# Patient Record
Sex: Female | Born: 1990 | Race: Black or African American | Hispanic: No | Marital: Single | Smoking: Former smoker
Health system: Southern US, Community
[De-identification: ages and names within clinical notes are randomized; demographics above are authoritative.]

## PROBLEM LIST (undated history)

## (undated) ENCOUNTER — Inpatient Hospital Stay (HOSPITAL_COMMUNITY): Payer: Self-pay

## (undated) DIAGNOSIS — R519 Headache, unspecified: Secondary | ICD-10-CM

## (undated) DIAGNOSIS — R51 Headache: Secondary | ICD-10-CM

## (undated) DIAGNOSIS — Z789 Other specified health status: Secondary | ICD-10-CM

## (undated) HISTORY — PX: NO PAST SURGERIES: SHX2092

---
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
## 2008-11-22 IMAGING — US US OB COMP LESS 14 WK
1 series · 14 of 19 positions shown · non-contrast
Comparison: None.

CLINICAL DATA: Positive pregnancy test with cramping

[Series 1: us ob comp less 14 wk · 0.19mm/px · 14 of 19 slices shown]
[im 1/19]
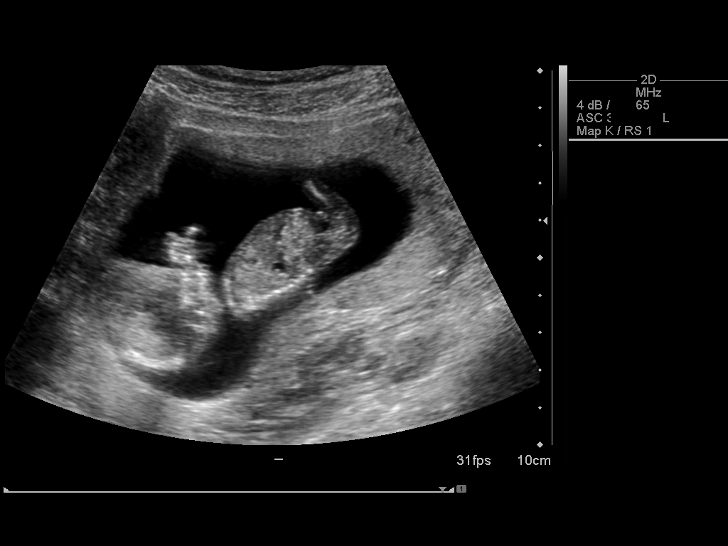
[im 3/19]
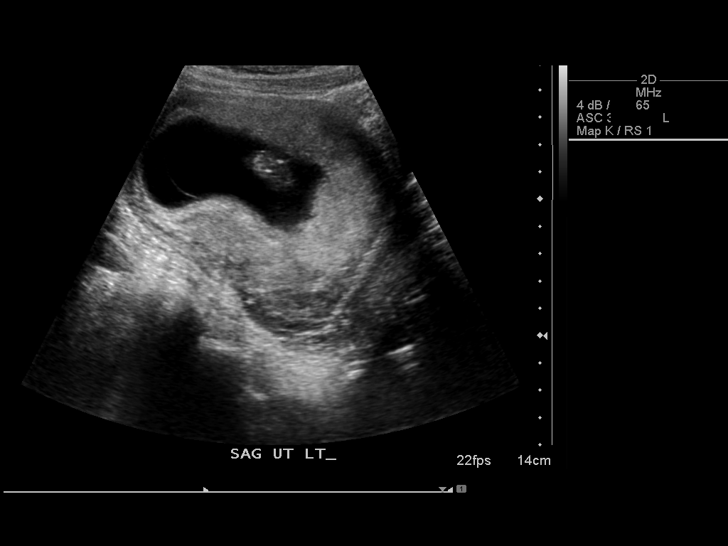
[im 4/19]
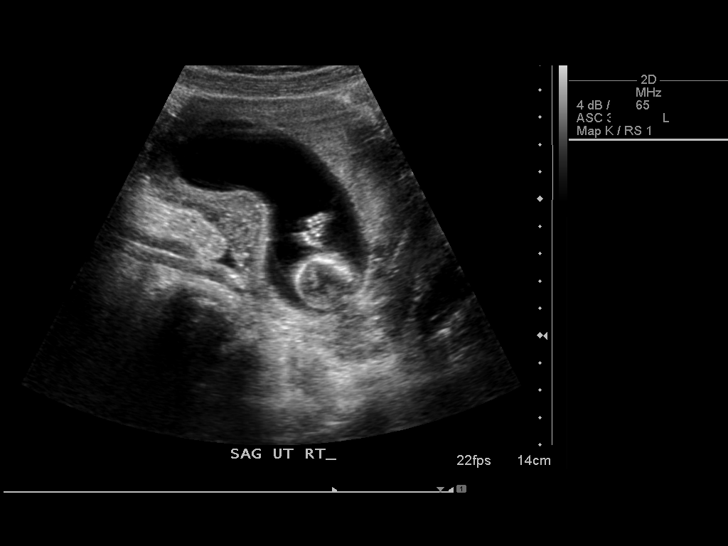
[im 5/19]
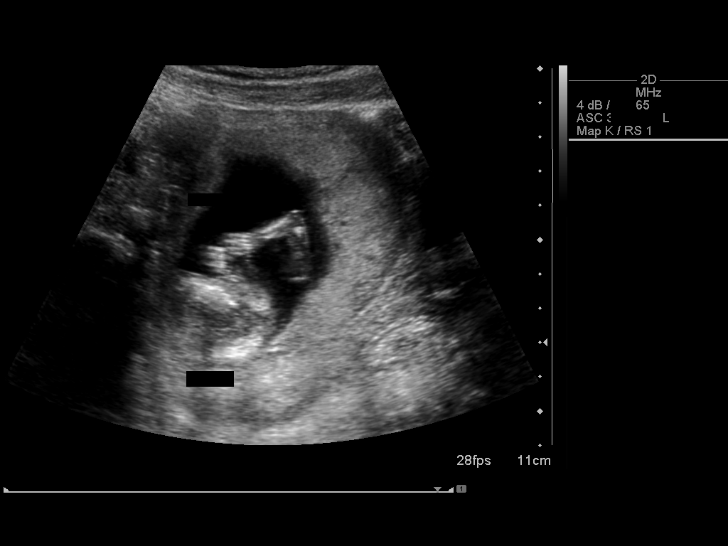
[im 7/19]
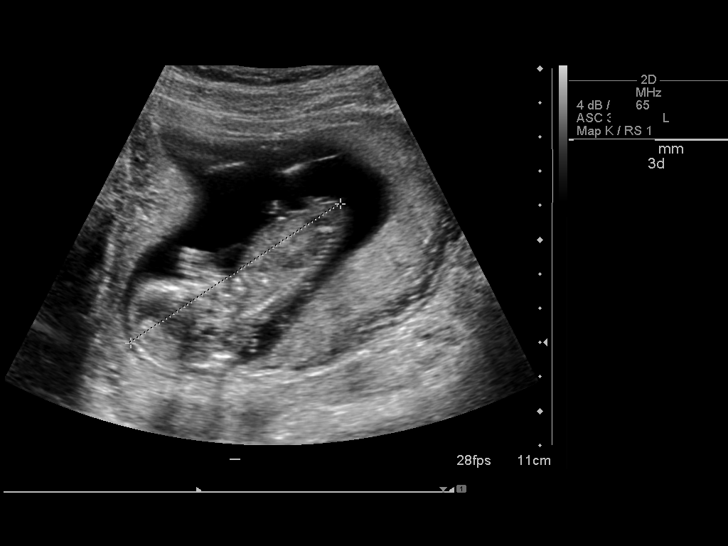
[im 8/19]
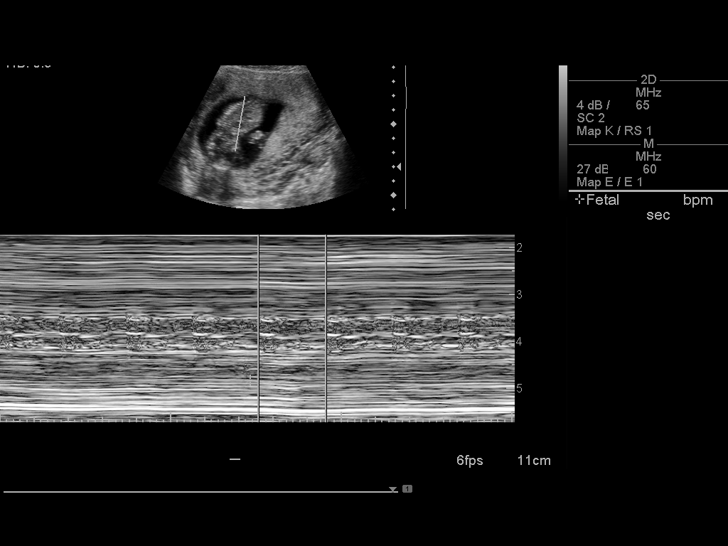
[im 9/19]
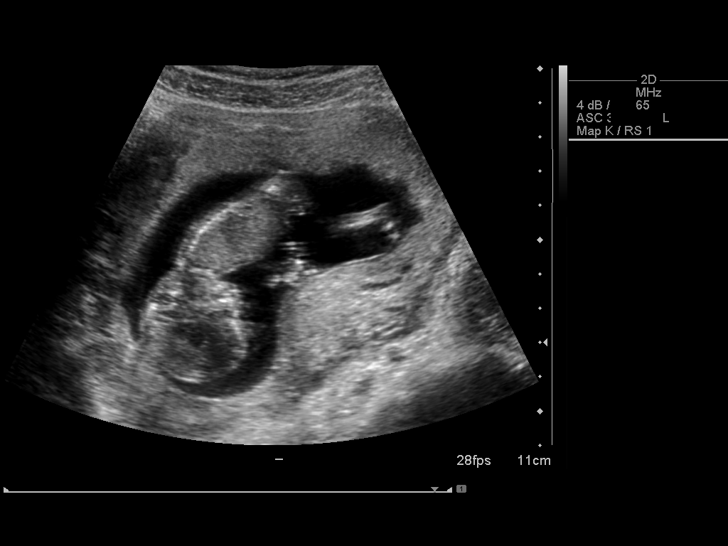
[im 11/19]
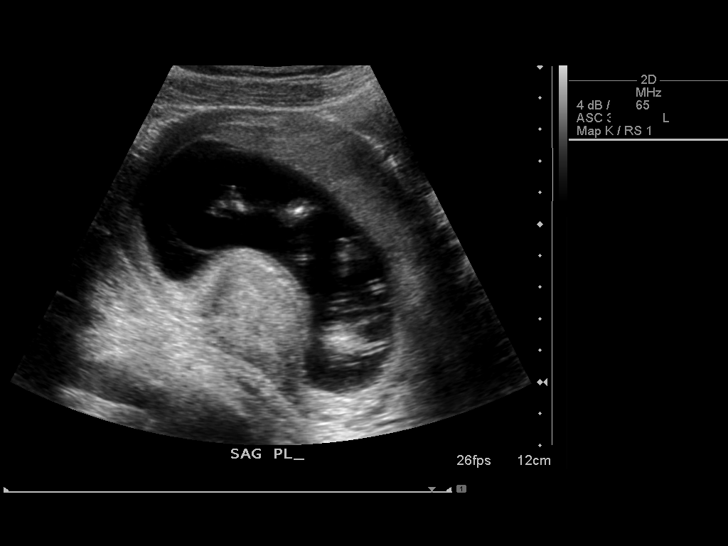
[im 12/19]
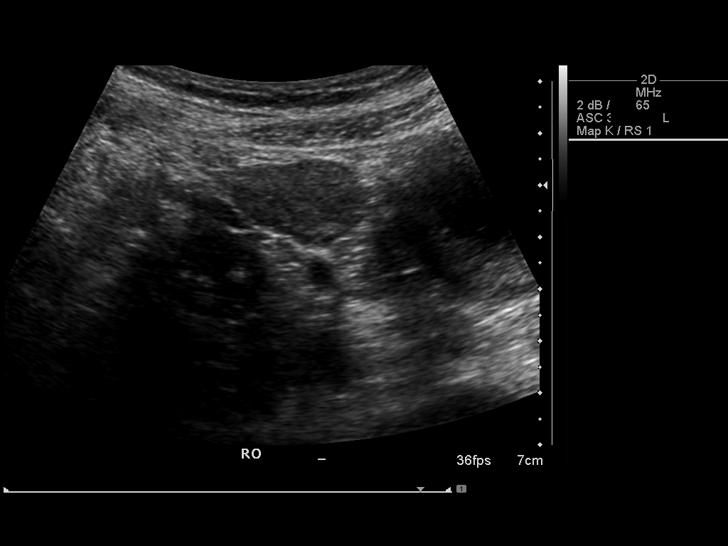
[im 13/19]
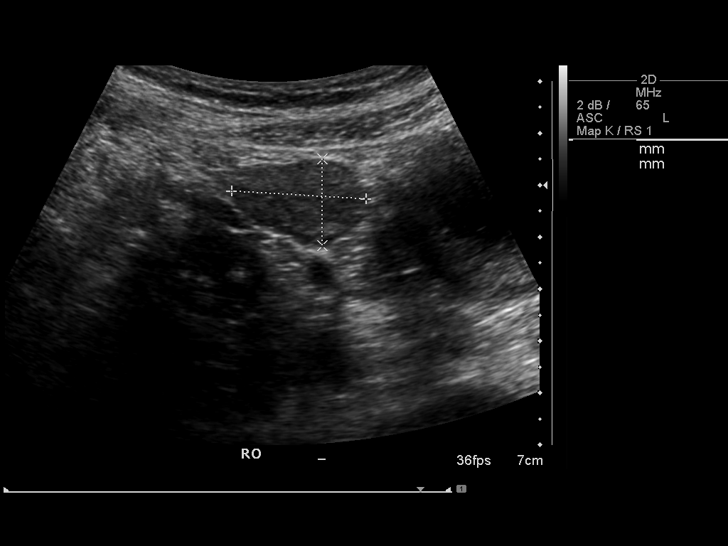
[im 15/19]
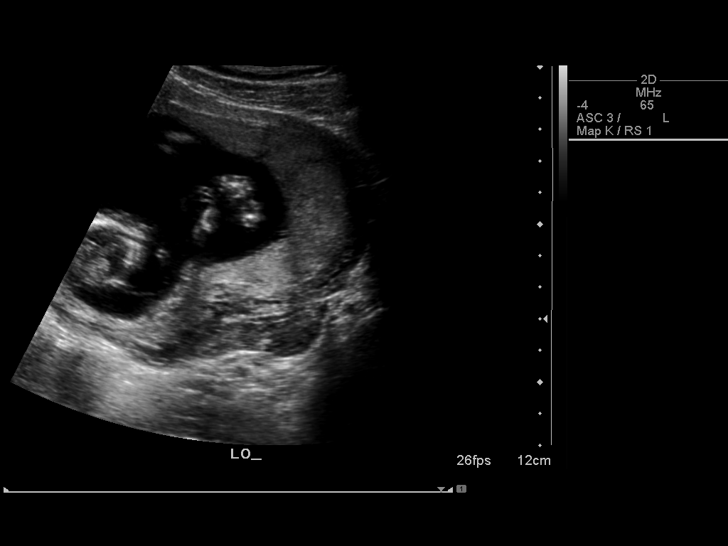
[im 16/19]
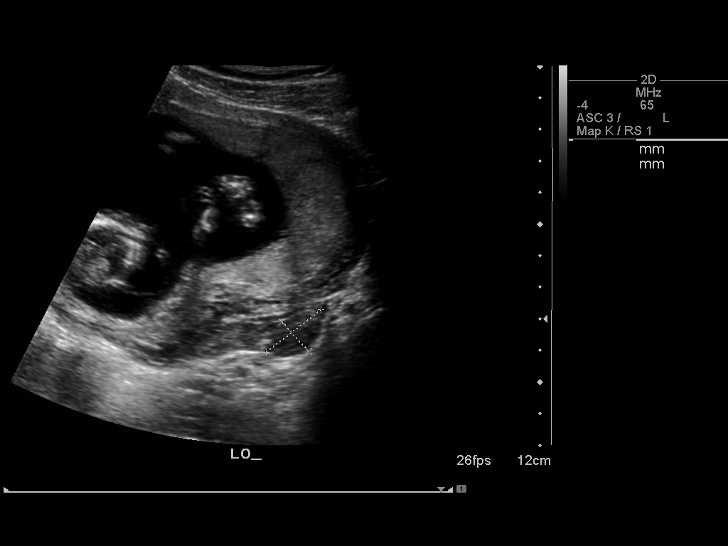
[im 17/19]
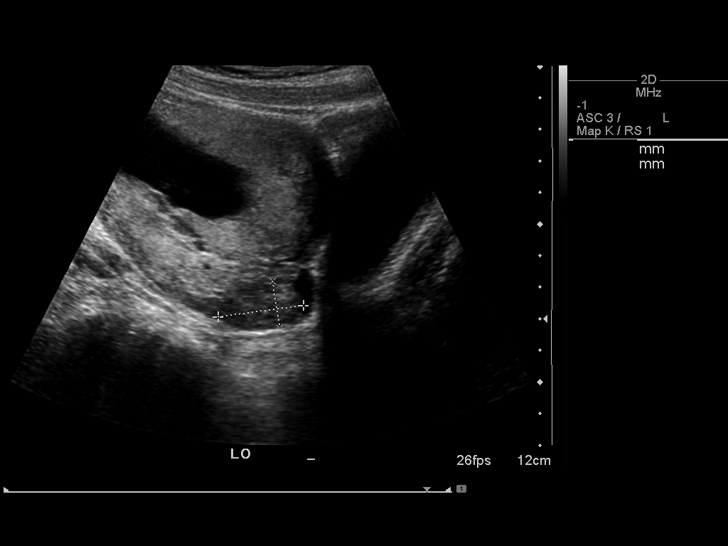
[im 19/19]
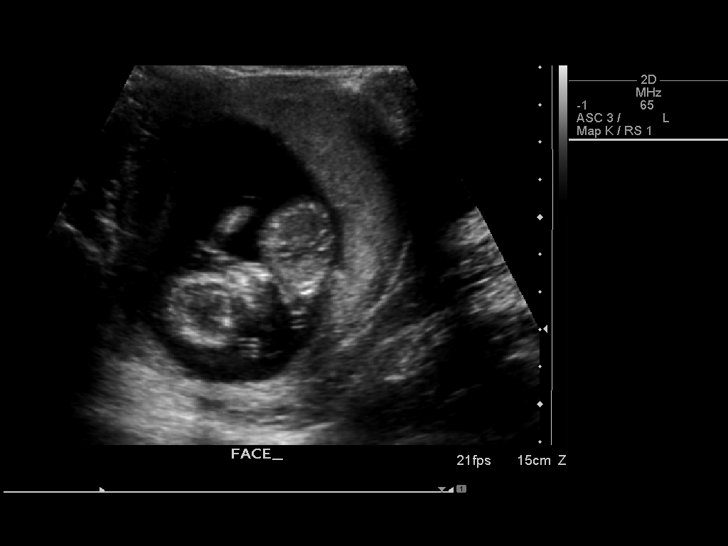

[14 of 19 positions shown; findings below may reference images not displayed]

OBSTETRICAL ULTRASOUND (<14 WK/AND TRANSVAGINAL OB):

Transabdominal and endovaginal scanning was performed.
FINDING: A single living intrauterine gestational sac is identified. A fetus and
amnion are evident although no yolk sac is visualized. Fetal heart activity is
noted and the fetal heart rate is measured at 152 beats per minute. No evidence
for subchorionic hemorrhage.

The crown-rump length is 7.3 cm which estimates a 13 week 3 day gestational age.

The ovaries are unremarkable. There is no free intraperitoneal fluid.
IMPRESSION: Single living intrauterine gestation at 13 week 3 day estimated gestational age
by ultrasound.

## 2011-03-26 LAB — RPR: RPR Ser Ql: NONREACTIVE

## 2011-03-26 LAB — CBC
HCT: 36.1
Hemoglobin: 14.8
MCHC: 34.2
MCV: 91.9
MCV: 93.9
RBC: 3.84 — ABNORMAL LOW
RBC: 4.59
RDW: 13.1
RDW: 13.6
WBC: 9.1

## 2011-04-07 LAB — URINE CULTURE

## 2011-04-07 LAB — URINALYSIS, ROUTINE W REFLEX MICROSCOPIC
Ketones, ur: NEGATIVE
Nitrite: POSITIVE — AB
Specific Gravity, Urine: 1.02

## 2011-04-07 LAB — URINE MICROSCOPIC-ADD ON

## 2011-04-07 LAB — WET PREP, GENITAL
Clue Cells Wet Prep HPF POC: NONE SEEN
Yeast Wet Prep HPF POC: NONE SEEN

## 2011-04-07 LAB — HCG, QUANTITATIVE, PREGNANCY: hCG, Beta Chain, Quant, S: 85354 — ABNORMAL HIGH

## 2011-04-07 LAB — CBC
HCT: 33.2 — ABNORMAL LOW
Platelets: 253
RBC: 3.92
RDW: 15.9 — ABNORMAL HIGH

## 2011-04-07 LAB — GC/CHLAMYDIA PROBE AMP, GENITAL: GC Probe Amp, Genital: NEGATIVE

## 2011-04-07 LAB — POCT PREGNANCY, URINE
Operator id: 114931
Preg Test, Ur: POSITIVE

## 2012-03-10 ENCOUNTER — Ambulatory Visit: Payer: Self-pay | Admitting: Family Medicine

## 2012-03-10 VITALS — BP 127/74 | HR 76 | Temp 98.6°F | Resp 16 | Ht 67.5 in | Wt 141.0 lb

## 2012-03-10 DIAGNOSIS — S39012A Strain of muscle, fascia and tendon of lower back, initial encounter: Secondary | ICD-10-CM

## 2012-03-10 DIAGNOSIS — IMO0002 Reserved for concepts with insufficient information to code with codable children: Secondary | ICD-10-CM

## 2012-03-10 MED ORDER — TRAMADOL HCL 50 MG PO TABS: 50.0000 mg | ORAL_TABLET | Freq: Three times a day (TID) | ORAL | Status: AC | PRN

## 2012-03-10 MED ORDER — CYCLOBENZAPRINE HCL 10 MG PO TABS: 10.0000 mg | ORAL_TABLET | Freq: Two times a day (BID) | ORAL | Status: AC | PRN

## 2012-03-10 NOTE — Patient Instructions (Signed)
If you are not better in the next few days please let me know.

## 2012-03-10 NOTE — Progress Notes (Signed)
[facility] [address] [place] [address] [phone]  Date:  03/10/2012   Name:  Kelly Mcguire   DOB:  12-Mar-1991   MRN:  [id number]  PCP:  No primary provider on file.    Chief Complaint: Back Pain   History of Present Illness:  Kelly Mcguire is a 21 y.o. very pleasant female patient who presents with the following:  On 20-Dec-2022 she was doing some dead lifts at the gym. She has had problems with her back a few times after she has dead- lifted.  She did not have any pain until the next day.  She hurts down the middle of her back in the lumbar area.  No leg involvement. No incontinence of bowel or bladder.    She is generally healthy.  She has tried some ibuprofen and ice.  However these measures are not helping her much.  She has most of her pain during the day, especially if she bends down.    There is no problem list on file for this patient.   No past medical history on file.  No past surgical history on file.  History  Substance Use Topics  . Smoking status: Never Smoker   . Smokeless tobacco: Not on file  . Alcohol Use: Not on file    No family history on file.  No Known Allergies  Medication list has been reviewed and updated.  No current outpatient prescriptions on file prior to visit.    Review of Systems:  As per HPI- otherwise negative.   Physical Examination: Filed Vitals:   03/10/12 1448  BP: 127/74  Pulse: 76  Temp: 98.6 F (37 C)  Resp: 16   Filed Vitals:   03/10/12 1448  Height: 5' 7.5" (1.715 m)  Weight: 141 lb (63.957 kg)   Body mass index is 21.76 kg/(m^2). Ideal Body Weight: Weight in (lb) to have BMI = 25: 161.7   GEN: WDWN, NAD, Non-toxic, A & O x 3 HEENT: Atraumatic, Normocephalic. Neck supple. No masses, No LAD. Ears and Nose: No external deformity. CV: RRR, No M/G/R. No JVD. No thrill. No extra heart sounds. PULM: CTA B, no wheezes, crackles, rhonchi. No retractions. No resp. distress. No  accessory muscle use. Back: mild tenderness in Paraspinous muscles of lumbar area.  Stiffness to flexion, normal rotation. Normal strength and sensation of both legs, negative SLR, normal DTR both legs.   EXTR: No c/c/e NEURO Normal gait.  PSYCH: Normally interactive. Conversant. Not depressed or anxious appearing.  Calm demeanor.    Assessment and Plan: 1. Back strain  cyclobenzaprine (FLEXERIL) 10 MG tablet, traMADol (ULTRAM) 50 MG tablet   Back strain- treat as above.  She has been hurt with doing dead lifts a few times in the past, so I suggested that she avoid this exercise in the future.  Let me know if not better in the next couple of days.    Abbe Amsterdam, MD

## 2018-06-13 ENCOUNTER — Encounter (HOSPITAL_COMMUNITY): Payer: Self-pay | Admitting: *Deleted

## 2018-06-13 ENCOUNTER — Other Ambulatory Visit: Payer: Self-pay

## 2018-06-13 ENCOUNTER — Inpatient Hospital Stay (HOSPITAL_COMMUNITY)
Admission: AD | Admit: 2018-06-13 | Discharge: 2018-06-13 | Disposition: A | Discharge status: Home / Self Care | Payer: Self-pay | Attending: Obstetrics and Gynecology | Admitting: Obstetrics and Gynecology

## 2018-06-13 DIAGNOSIS — R0600 Dyspnea, unspecified: Secondary | ICD-10-CM

## 2018-06-13 DIAGNOSIS — Z3A18 18 weeks gestation of pregnancy: Secondary | ICD-10-CM | POA: Insufficient documentation

## 2018-06-13 DIAGNOSIS — R51 Headache: Secondary | ICD-10-CM | POA: Insufficient documentation

## 2018-06-13 DIAGNOSIS — O26892 Other specified pregnancy related conditions, second trimester: Secondary | ICD-10-CM | POA: Insufficient documentation

## 2018-06-13 DIAGNOSIS — Z3492 Encounter for supervision of normal pregnancy, unspecified, second trimester: Secondary | ICD-10-CM

## 2018-06-13 DIAGNOSIS — Z87891 Personal history of nicotine dependence: Secondary | ICD-10-CM | POA: Insufficient documentation

## 2018-06-13 HISTORY — DX: Headache, unspecified: R51.9

## 2018-06-13 HISTORY — DX: Headache: R51

## 2018-06-13 LAB — CBC WITH DIFFERENTIAL/PLATELET
Basophils Absolute: 0 10*3/uL (ref 0.0–0.1)
Basophils Relative: 0 %
EOS PCT: 1 %
Eosinophils Absolute: 0 10*3/uL (ref 0.0–0.5)
HCT: 35.5 % — ABNORMAL LOW (ref 36.0–46.0)
Hemoglobin: 12.3 g/dL (ref 12.0–15.0)
LYMPHS ABS: 1.7 10*3/uL (ref 0.7–4.0)
Lymphocytes Relative: 22 %
MCH: 30.2 pg (ref 26.0–34.0)
MCHC: 34.6 g/dL (ref 30.0–36.0)
MCV: 87.2 fL (ref 80.0–100.0)
MONO ABS: 0.6 10*3/uL (ref 0.1–1.0)
MONOS PCT: 8 %
Neutro Abs: 5.4 10*3/uL (ref 1.7–7.7)
Neutrophils Relative %: 69 %
PLATELETS: 206 10*3/uL (ref 150–400)
RBC: 4.07 MIL/uL (ref 3.87–5.11)
RDW: 13.8 % (ref 11.5–15.5)
WBC: 7.8 10*3/uL (ref 4.0–10.5)
nRBC: 0 % (ref 0.0–0.2)

## 2018-06-13 LAB — URINALYSIS, ROUTINE W REFLEX MICROSCOPIC
BILIRUBIN URINE: NEGATIVE
Glucose, UA: NEGATIVE mg/dL
HGB URINE DIPSTICK: NEGATIVE
Ketones, ur: NEGATIVE mg/dL
Leukocytes, UA: NEGATIVE
Nitrite: NEGATIVE
Protein, ur: NEGATIVE mg/dL
SPECIFIC GRAVITY, URINE: 1.02 (ref 1.005–1.030)
pH: 6 (ref 5.0–8.0)

## 2018-06-13 LAB — COMPREHENSIVE METABOLIC PANEL
ALT: 22 U/L (ref 0–44)
AST: 20 U/L (ref 15–41)
Albumin: 3.4 g/dL — ABNORMAL LOW (ref 3.5–5.0)
Alkaline Phosphatase: 51 U/L (ref 38–126)
Anion gap: 7 (ref 5–15)
BUN: 10 mg/dL (ref 6–20)
CO2: 22 mmol/L (ref 22–32)
Calcium: 8.8 mg/dL — ABNORMAL LOW (ref 8.9–10.3)
Chloride: 103 mmol/L (ref 98–111)
Creatinine, Ser: 0.49 mg/dL (ref 0.44–1.00)
Glucose, Bld: 77 mg/dL (ref 70–99)
POTASSIUM: 3.3 mmol/L — AB (ref 3.5–5.1)
Sodium: 132 mmol/L — ABNORMAL LOW (ref 135–145)
TOTAL PROTEIN: 7.3 g/dL (ref 6.5–8.1)
Total Bilirubin: 0.2 mg/dL — ABNORMAL LOW (ref 0.3–1.2)

## 2018-06-13 LAB — RAPID URINE DRUG SCREEN, HOSP PERFORMED
AMPHETAMINES: NOT DETECTED
Barbiturates: NOT DETECTED
Benzodiazepines: NOT DETECTED
COCAINE: NOT DETECTED
OPIATES: NOT DETECTED
Tetrahydrocannabinol: NOT DETECTED

## 2018-06-13 MED ORDER — PREPLUS 27-1 MG PO TABS: 1.0000 | ORAL_TABLET | Freq: Every day | ORAL | 13 refills | Status: AC

## 2018-06-13 MED ORDER — ACETAMINOPHEN 500 MG PO TABS
1000.0000 mg | ORAL_TABLET | Freq: Once | ORAL | Status: AC
  Administered 2018-06-13: 1000 mg via ORAL
  Filled 2018-06-13: qty 2

## 2018-06-13 NOTE — MAU Provider Note (Signed)
History     CSN: [id number]  Arrival date and time: 06/13/18 1223   First Provider Initiated Contact with Patient 06/13/18 1403      Chief Complaint  Patient presents with  . Headache  . Shortness of Breath   HPI Kelly Mcguire is a 27 y.o. G3P1011 at [redacted]w[redacted]d who presents to MAU with chief complaint of sharp stabbing chest pain with SOB. This is a recurrent problem with new level of severity last night at 11:00pm and lasting for 30 minutes. She denies history of cardiac issues, acid reflux, drug use, smoking, recent illness or fever.   Headache Patient also complains of headache, new onset during pregnancy. She endorses frontal headaches without aura or loss of vision or sensations. She rates them as 7-9/10 and states she is missing 2-3 days per week of work which is causing her significant stress. Patient has not taken medication or tried other treatments for this problem.   Patient denies pregnancy-related complaints. She denies vaginal bleeding, abdominal pain, abdominal tenderness and UTI symptoms.  Patient has not initiated prenatal care. She states she cannot afford to pay out of pocket for [facility] and her insurance does not start until 2020.  OB History    Gravida  3   Para  1   Term  1   Preterm      AB  1   Living  1     SAB  1   TAB      Ectopic      Multiple      Live Births  1           Past Medical History:  Diagnosis Date  . Headache     Past Surgical History:  Procedure Laterality Date  . NO PAST SURGERIES      No family history on file.  Social History   Tobacco Use  . Smoking status: Former Games developer  . Smokeless tobacco: Former Neurosurgeon  . Tobacco comment: Hooka  Substance Use Topics  . Alcohol use: Not Currently  . Drug use: Never    Allergies: No Known Allergies  Medications Prior to Admission  Medication Sig Dispense Refill Last Dose  . ibuprofen (ADVIL,MOTRIN) 400 MG tablet Take 400 mg by mouth every 6 (six) hours as  needed.       Review of Systems  Constitutional: Negative for chills and fatigue.  Respiratory: Positive for shortness of breath. Negative for apnea, cough, choking, chest tightness, wheezing and stridor.   Gastrointestinal: Negative for abdominal pain.  Genitourinary: Negative for vaginal bleeding, vaginal discharge and vaginal pain.  Musculoskeletal: Negative for back pain.  Neurological: Positive for headaches.  All other systems reviewed and are negative.  Physical Exam   Pulse (!) 110, temperature 98 F (36.7 C), temperature source Oral, resp. rate 16, weight 73.1 kg, last menstrual period 02/03/2018, SpO2 99 %.  Physical Exam  Nursing note and vitals reviewed. Constitutional: She appears well-developed and well-nourished.  Cardiovascular: Normal rate, normal heart sounds and intact distal pulses.  Respiratory: Effort normal and breath sounds normal. No tachypnea.  GI: Soft. She exhibits no distension and no mass. There is no abdominal tenderness. There is no rebound, no guarding and no CVA tenderness.  Gravid  Skin: Skin is warm and dry.  Psychiatric: She has a normal mood and affect. Her behavior is normal. Judgment and thought content normal.    MAU Course/MDM   --No concerning findings on physical exam or labs collected today --EKG reviewed,  normal sinus rhythm. Patient speaking in full sentences, ambulating without difficulty, no signs of air hunger --Patient's pain resolved with Tylenol administered in MAU --Discussed programs [facility] for [facility]. Strongly advised patient to [facility] or [facility] to learn more  Patient Vitals for the past 24 hrs:  Temp Temp src Pulse Resp SpO2 Weight  06/13/18 1513 - - - 16 - -  06/13/18 1244 98 F (36.7 C) Oral (!) 110 16 99 % 73.1 kg  06/13/18 1242 - - - - 100 % -    Results for orders placed or performed during the hospital encounter of 06/13/18 (from the past 24 hour(s))  Rapid urine drug screen (hospital  performed)     Status: None   Collection Time: 06/13/18  1:00 PM  Result Value Ref Range   Opiates NONE DETECTED NONE DETECTED   Cocaine NONE DETECTED NONE DETECTED   Benzodiazepines NONE DETECTED NONE DETECTED   Amphetamines NONE DETECTED NONE DETECTED   Tetrahydrocannabinol NONE DETECTED NONE DETECTED   Barbiturates NONE DETECTED NONE DETECTED  Urinalysis, Routine w reflex microscopic     Status: Abnormal   Collection Time: 06/13/18  1:06 PM  Result Value Ref Range   Color, Urine YELLOW YELLOW   APPearance HAZY (A) CLEAR   Specific Gravity, Urine 1.020 1.005 - 1.030   pH 6.0 5.0 - 8.0   Glucose, UA NEGATIVE NEGATIVE mg/dL   Hgb urine dipstick NEGATIVE NEGATIVE   Bilirubin Urine NEGATIVE NEGATIVE   Ketones, ur NEGATIVE NEGATIVE mg/dL   Protein, ur NEGATIVE NEGATIVE mg/dL   Nitrite NEGATIVE NEGATIVE   Leukocytes, UA NEGATIVE NEGATIVE  CBC with Differential/Platelet     Status: Abnormal   Collection Time: 06/13/18  2:21 PM  Result Value Ref Range   WBC 7.8 4.0 - 10.5 K/uL   RBC 4.07 3.87 - 5.11 MIL/uL   Hemoglobin 12.3 12.0 - 15.0 g/dL   HCT 16.135.5 (L) 09.636.0 - 04.546.0 %   MCV 87.2 80.0 - 100.0 fL   MCH 30.2 26.0 - 34.0 pg   MCHC 34.6 30.0 - 36.0 g/dL   RDW 40.913.8 81.111.5 - 91.415.5 %   Platelets 206 150 - 400 K/uL   nRBC 0.0 0.0 - 0.2 %   Neutrophils Relative % 69 %   Neutro Abs 5.4 1.7 - 7.7 K/uL   Lymphocytes Relative 22 %   Lymphs Abs 1.7 0.7 - 4.0 K/uL   Monocytes Relative 8 %   Monocytes Absolute 0.6 0.1 - 1.0 K/uL   Eosinophils Relative 1 %   Eosinophils Absolute 0.0 0.0 - 0.5 K/uL   Basophils Relative 0 %   Basophils Absolute 0.0 0.0 - 0.1 K/uL  Comprehensive metabolic panel     Status: Abnormal   Collection Time: 06/13/18  2:21 PM  Result Value Ref Range   Sodium 132 (L) 135 - 145 mmol/L   Potassium 3.3 (L) 3.5 - 5.1 mmol/L   Chloride 103 98 - 111 mmol/L   CO2 22 22 - 32 mmol/L   Glucose, Bld 77 70 - 99 mg/dL   BUN 10 6 - 20 mg/dL   Creatinine, Ser 7.820.49 0.44 - 1.00  mg/dL   Calcium 8.8 (L) 8.9 - 10.3 mg/dL   Total Protein 7.3 6.5 - 8.1 g/dL   Albumin 3.4 (L) 3.5 - 5.0 g/dL   AST 20 15 - 41 U/L   ALT 22 0 - 44 U/L   Alkaline Phosphatase 51 38 - 126 U/L   Total  Bilirubin 0.2 (L) 0.3 - 1.2 mg/dL   GFR calc non Af Amer >60 >60 mL/min   GFR calc Af Amer >60 >60 mL/min   Anion gap 7 5 - 15    Assessment and Plan  --27 y.o. G3P1011 at [redacted]w[redacted]d  --FHT 156 by Doppler --Headache responsive to Tylenol --Discharge home in stable condition  Calvert Cantor, CNM 06/13/2018, 4:13 PM

## 2018-06-13 NOTE — MAU Note (Signed)
Been having migraines every day.  Making her nauseous. Very sensitive to light.  Last night had pain in the middle of her back - came through to the front, not today. Having shortness of breath, feels like someone is sitting on her chest, like it is constricted.  The other day was having sharp pain in her RLQ, almost made her fall, no longer having it.  No care yet, waiting on insurance

## 2018-06-13 NOTE — Discharge Instructions (Signed)
Second Trimester of Pregnancy The second trimester is from week 13 through week 28, month 4 through 6. This is often the time in pregnancy that you feel your best. Often times, morning sickness has lessened or quit. You may have more energy, and you may get hungry more often. Your unborn baby (fetus) is growing rapidly. At the end of the sixth month, he or she is about 9 inches long and weighs about 1 pounds. You will likely feel the baby move (quickening) between 18 and 20 weeks of pregnancy. Follow these instructions at home:  Avoid all smoking, herbs, and alcohol. Avoid drugs not approved by your doctor.  Do not use any tobacco products, including cigarettes, chewing tobacco, and electronic cigarettes. If you need help quitting, ask your doctor. You may get counseling or other support to help you quit.  Only take medicine as told by your doctor. Some medicines are safe and some are not during pregnancy.  Exercise only as told by your doctor. Stop exercising if you start having cramps.  Eat regular, healthy meals.  Wear a good support bra if your breasts are tender.  Do not use hot tubs, steam rooms, or saunas.  Wear your seat belt when driving.  Avoid raw meat, uncooked cheese, and liter boxes and soil used by cats.  Take your prenatal vitamins.  Take 1500-2000 milligrams of calcium daily starting at the 20th week of pregnancy until you deliver your baby.  Try taking medicine that helps you poop (stool softener) as needed, and if your doctor approves. Eat more fiber by eating fresh fruit, vegetables, and whole grains. Drink enough fluids to keep your pee (urine) clear or pale yellow.  Take warm water baths (sitz baths) to soothe pain or discomfort caused by hemorrhoids. Use hemorrhoid cream if your doctor approves.  If you have puffy, bulging veins (varicose veins), wear support hose. Raise (elevate) your feet for 15 minutes, 3-4 times a day. Limit salt in your diet.  Avoid heavy  lifting, wear low heals, and sit up straight.  Rest with your legs raised if you have leg cramps or low back pain.  Visit your dentist if you have not gone during your pregnancy. Use a soft toothbrush to brush your teeth. Be gentle when you floss.  You can have sex (intercourse) unless your doctor tells you not to.  Go to your doctor visits. Get help if:  You feel dizzy.  You have mild cramps or pressure in your lower belly (abdomen).  You have a nagging pain in your belly area.  You continue to feel sick to your stomach (nauseous), throw up (vomit), or have watery poop (diarrhea).  You have bad smelling fluid coming from your vagina.  You have pain with peeing (urination). Get help right away if:  You have a fever.  You are leaking fluid from your vagina.  You have spotting or bleeding from your vagina.  You have severe belly cramping or pain.  You lose or gain weight rapidly.  You have trouble catching your breath and have chest pain.  You notice sudden or extreme puffiness (swelling) of your face, hands, ankles, feet, or legs.  You have not felt the baby move in over an hour.  You have severe headaches that do not go away with medicine.  You have vision changes. This information is not intended to replace advice given to you by your health care provider. Make sure you discuss any questions you have with your health care   provider. Document Released: 09/09/2009 Document Revised: 11/21/2015 Document Reviewed: 08/16/2012 Elsevier Interactive Patient Education  2017 [employer] Inc.  

## 2018-06-29 NOTE — L&D Delivery Note (Signed)
Delivery Note At 12:53 AM a viable and healthy female was delivered via Vaginal, Spontaneous (Presentation: left occiput ;anterior   ).  APGAR: 9, 9; weight pending  .   Placenta status: spontaneous, intact.  Cord: 3V   Anesthesia:  Epidural  Episiotomy: None Lacerations: 2nd degree Suture Repair: 3.0 vicryl Est. Blood Loss (mL): 324  Mom to postpartum.  Baby to Couplet care / Skin to Skin.  [name] 11/04/2018, 1:14 AM

## 2018-07-16 ENCOUNTER — Encounter (HOSPITAL_COMMUNITY): Payer: Self-pay

## 2018-07-16 ENCOUNTER — Other Ambulatory Visit: Payer: Self-pay

## 2018-07-16 ENCOUNTER — Inpatient Hospital Stay (HOSPITAL_COMMUNITY)
Admission: AD | Admit: 2018-07-16 | Discharge: 2018-07-16 | Disposition: A | Discharge status: Home / Self Care | Payer: 59 | Source: Ambulatory Visit | Attending: Obstetrics and Gynecology | Admitting: Obstetrics and Gynecology

## 2018-07-16 DIAGNOSIS — F419 Anxiety disorder, unspecified: Secondary | ICD-10-CM

## 2018-07-16 DIAGNOSIS — Z87891 Personal history of nicotine dependence: Secondary | ICD-10-CM | POA: Diagnosis not present

## 2018-07-16 DIAGNOSIS — K219 Gastro-esophageal reflux disease without esophagitis: Secondary | ICD-10-CM | POA: Insufficient documentation

## 2018-07-16 DIAGNOSIS — O99342 Other mental disorders complicating pregnancy, second trimester: Secondary | ICD-10-CM | POA: Diagnosis not present

## 2018-07-16 DIAGNOSIS — Z3A23 23 weeks gestation of pregnancy: Secondary | ICD-10-CM | POA: Insufficient documentation

## 2018-07-16 DIAGNOSIS — O99612 Diseases of the digestive system complicating pregnancy, second trimester: Secondary | ICD-10-CM | POA: Insufficient documentation

## 2018-07-16 DIAGNOSIS — O26892 Other specified pregnancy related conditions, second trimester: Secondary | ICD-10-CM | POA: Diagnosis present

## 2018-07-16 LAB — URINALYSIS, ROUTINE W REFLEX MICROSCOPIC
Bilirubin Urine: NEGATIVE
Glucose, UA: NEGATIVE mg/dL
Hgb urine dipstick: NEGATIVE
Ketones, ur: NEGATIVE mg/dL
Nitrite: NEGATIVE
PH: 8 (ref 5.0–8.0)
Protein, ur: NEGATIVE mg/dL
SPECIFIC GRAVITY, URINE: 1.018 (ref 1.005–1.030)

## 2018-07-16 LAB — RAPID URINE DRUG SCREEN, HOSP PERFORMED
AMPHETAMINES: NOT DETECTED
BARBITURATES: NOT DETECTED
BENZODIAZEPINES: NOT DETECTED
COCAINE: NOT DETECTED
Opiates: NOT DETECTED
Tetrahydrocannabinol: NOT DETECTED

## 2018-07-16 MED ORDER — LIDOCAINE VISCOUS HCL 2 % MT SOLN
15.0000 mL | Freq: Once | OROMUCOSAL | Status: AC
  Administered 2018-07-16: 15 mL via ORAL
  Filled 2018-07-16: qty 15

## 2018-07-16 MED ORDER — RANITIDINE HCL 150 MG PO TABS: 150.0000 mg | ORAL_TABLET | Freq: Two times a day (BID) | ORAL | 0 refills | Status: DC

## 2018-07-16 MED ORDER — HYDROXYZINE PAMOATE 25 MG PO CAPS: 25.0000 mg | ORAL_CAPSULE | Freq: Three times a day (TID) | ORAL | 0 refills | Status: DC | PRN

## 2018-07-16 MED ORDER — ALUM & MAG HYDROXIDE-SIMETH 200-200-20 MG/5ML PO SUSP
30.0000 mL | Freq: Once | ORAL | Status: AC
  Administered 2018-07-16: 30 mL via ORAL
  Filled 2018-07-16: qty 30

## 2018-07-16 NOTE — MAU Note (Signed)
Kelly CHEEVES is a 28 y.o. at [redacted]w[redacted]d here in MAU reporting:  +insomnia +decreased appetite. States prior to last night has not been able to keep anything down (+emesis) +sharp mid chest pain. Intermittent. +leg numbness Onset of complaint: 2 days Pain score: 8/10. (patient smiling and texting on phone during triage) Vitals:   07/16/18 1154  BP: 112/70  Pulse: 79  Resp: 18  Temp: 98.3 F (36.8 C)  SpO2: 100%   FHT: 156 via doppler Lab orders placed from triage: ua

## 2018-07-16 NOTE — Discharge Instructions (Signed)

## 2018-07-16 NOTE — MAU Provider Note (Signed)
History     CSN: [id number]  Arrival date and time: 07/16/18 1140   First Provider Initiated Contact with Patient 07/16/18 1232      Chief Complaint  Patient presents with  . Insomnia  . Chest Pain  . Emesis   HPI   [name] is a 28 y.o. female G84P1011 @[redacted]w[redacted]d  here with multiple complaints "I just wanted to make sure everything was ok". Says that she has had insomnia for the last 2 days; she has not taken anything for that. She has been anxious with this pregnancy. Worrying at night and during the day. She has not spoke to her OB about this however plans to. Says she has had decreased appetite, vomiting yesterday and burning in her chest. "When I throw up it is a lot". She has medication at home for nausea however Is not taking it. Last meal she ate was yesterday and it was baked chicken. + fetal movement, no bleeding.   Denies chest pain at this time. Rates her pain 0/10.   OB History    Gravida  3   Para  1   Term  1   Preterm      AB  1   Living  1     SAB  1   TAB      Ectopic      Multiple      Live Births  1           Past Medical History:  Diagnosis Date  . Headache     Past Surgical History:  Procedure Laterality Date  . NO PAST SURGERIES      History reviewed. No pertinent family history.  Social History   Tobacco Use  . Smoking status: Former Games developer  . Smokeless tobacco: Former Neurosurgeon  . Tobacco comment: Hooka  Substance Use Topics  . Alcohol use: Not Currently  . Drug use: Never    Allergies: No Known Allergies  No medications prior to admission.   Results for orders placed or performed during the hospital encounter of 07/16/18 (from the past 48 hour(s))  Urinalysis, Routine w reflex microscopic     Status: Abnormal   Collection Time: 07/16/18 12:10 PM  Result Value Ref Range   Color, Urine YELLOW YELLOW   APPearance CLOUDY (A) CLEAR   Specific Gravity, Urine 1.018 1.005 - 1.030   pH 8.0 5.0 - 8.0   Glucose, UA  NEGATIVE NEGATIVE mg/dL   Hgb urine dipstick NEGATIVE NEGATIVE   Bilirubin Urine NEGATIVE NEGATIVE   Ketones, ur NEGATIVE NEGATIVE mg/dL   Protein, ur NEGATIVE NEGATIVE mg/dL   Nitrite NEGATIVE NEGATIVE   Leukocytes, UA SMALL (A) NEGATIVE   RBC / HPF 0-5 0 - 5 RBC/hpf   WBC, UA 21-50 0 - 5 WBC/hpf   Bacteria, UA RARE (A) NONE SEEN   Squamous Epithelial / LPF 21-50 0 - 5   Mucus PRESENT     Comment: Performed at [facility], [address] [place], [place] [address]  Urine rapid drug screen (hosp performed)     Status: None   Collection Time: 07/16/18 12:10 PM  Result Value Ref Range   Opiates NONE DETECTED NONE DETECTED   Cocaine NONE DETECTED NONE DETECTED   Benzodiazepines NONE DETECTED NONE DETECTED   Amphetamines NONE DETECTED NONE DETECTED   Tetrahydrocannabinol NONE DETECTED NONE DETECTED   Barbiturates NONE DETECTED NONE DETECTED    Comment: (NOTE) DRUG SCREEN FOR MEDICAL PURPOSES ONLY.  IF CONFIRMATION IS  NEEDED FOR ANY PURPOSE, NOTIFY LAB WITHIN 5 DAYS. LOWEST DETECTABLE LIMITS FOR URINE DRUG SCREEN Drug Class                     Cutoff (ng/mL) Amphetamine and metabolites    1000 Barbiturate and metabolites    200 Benzodiazepine                 200 Tricyclics and metabolites     300 Opiates and metabolites        300 Cocaine and metabolites        300 THC                            50 Performed at [facility], [address], [place], [place] [address]      Review of Systems  Constitutional: Positive for fatigue.  Genitourinary: Negative for dysuria.   Physical Exam   Blood pressure 113/69, pulse 86, temperature 98.3 F (36.8 C), temperature source Oral, resp. rate 18, weight 74.8 kg, last menstrual period 02/03/2018, SpO2 100 %.  Physical Exam  Constitutional: She is oriented to person, place, and time. She appears well-developed and well-nourished. No distress.  HENT:  Head: Normocephalic.  Eyes: Pupils are equal, round, and reactive to  light.  Cardiovascular: Normal rate.  Respiratory: Effort normal.  GI: Soft. She exhibits no distension. There is no abdominal tenderness. There is no rebound and no guarding.  Musculoskeletal: Normal range of motion.  Neurological: She is alert and oriented to person, place, and time.  Skin: Skin is warm. She is not diaphoretic.  Psychiatric: Her behavior is normal. She expresses no homicidal and no suicidal ideation. She expresses no suicidal plans and no homicidal plans.   Fetal Tracing: Baseline: 145 bpm Variability: Moderate  Accelerations: 10x10 Decelerations: None Toco: None  MAU Course  Procedures  None  MDM  Patient declines Zofran or medication for nausea.  She attempted a GI cocktail however spit it out She continues to have 0/10 pain.  She is smiling, conversing and laughing in the room with myself and her friend in the room.   Assessment and Plan   A:  1. Anxiety, mild   2. Gastroesophageal reflux disease, esophagitis presence not specified     P:  Discharge home in stable condition Rx: Vistaril, Zantac  Return to MAU for emergencies  Discussed meditation, no caffeine after 5:00 pm, Discuss anxiety with OB  Duane Lope, NP 07/16/2018 3:04 PM

## 2018-08-30 ENCOUNTER — Other Ambulatory Visit: Payer: Self-pay

## 2018-08-30 ENCOUNTER — Encounter (HOSPITAL_COMMUNITY): Payer: Self-pay

## 2018-08-30 ENCOUNTER — Inpatient Hospital Stay (HOSPITAL_COMMUNITY)
Admission: AD | Admit: 2018-08-30 | Discharge: 2018-08-30 | Disposition: A | Discharge status: Home / Self Care | Payer: 59 | Attending: Obstetrics and Gynecology | Admitting: Obstetrics and Gynecology

## 2018-08-30 DIAGNOSIS — Z3689 Encounter for other specified antenatal screening: Secondary | ICD-10-CM | POA: Diagnosis not present

## 2018-08-30 DIAGNOSIS — O9989 Other specified diseases and conditions complicating pregnancy, childbirth and the puerperium: Secondary | ICD-10-CM

## 2018-08-30 DIAGNOSIS — N898 Other specified noninflammatory disorders of vagina: Secondary | ICD-10-CM | POA: Insufficient documentation

## 2018-08-30 DIAGNOSIS — O26893 Other specified pregnancy related conditions, third trimester: Secondary | ICD-10-CM | POA: Insufficient documentation

## 2018-08-30 DIAGNOSIS — M549 Dorsalgia, unspecified: Secondary | ICD-10-CM | POA: Insufficient documentation

## 2018-08-30 DIAGNOSIS — Z3A29 29 weeks gestation of pregnancy: Secondary | ICD-10-CM | POA: Diagnosis not present

## 2018-08-30 DIAGNOSIS — Z87891 Personal history of nicotine dependence: Secondary | ICD-10-CM | POA: Diagnosis not present

## 2018-08-30 LAB — URINALYSIS, ROUTINE W REFLEX MICROSCOPIC
Bilirubin Urine: NEGATIVE
Glucose, UA: NEGATIVE mg/dL
Hgb urine dipstick: NEGATIVE
Ketones, ur: NEGATIVE mg/dL
Leukocytes,Ua: NEGATIVE
Nitrite: NEGATIVE
Protein, ur: NEGATIVE mg/dL
Specific Gravity, Urine: 1.018 (ref 1.005–1.030)
pH: 8 (ref 5.0–8.0)

## 2018-08-30 LAB — WET PREP, GENITAL
Clue Cells Wet Prep HPF POC: NONE SEEN
Sperm: NONE SEEN
Trich, Wet Prep: NONE SEEN
Yeast Wet Prep HPF POC: NONE SEEN

## 2018-08-30 LAB — POCT FERN TEST: POCT Fern Test: NEGATIVE

## 2018-08-30 MED ORDER — CYCLOBENZAPRINE HCL 10 MG PO TABS
10.0000 mg | ORAL_TABLET | Freq: Three times a day (TID) | ORAL | Status: DC | PRN
  Administered 2018-08-30: 10 mg via ORAL
  Filled 2018-08-30: qty 1

## 2018-08-30 MED ORDER — COMFORT FIT MATERNITY SUPP MED MISC: 1.0000 | Freq: Every day | 0 refills | Status: DC

## 2018-08-30 NOTE — Discharge Instructions (Signed)
Back Pain in Pregnancy  Back pain during pregnancy is common. Back pain may be caused by several factors that are related to changes during your pregnancy.  Follow these instructions at home:  Managing pain, stiffness, and swelling          If directed, for sudden (acute) back pain, put ice on the painful area.  ? Put ice in a plastic bag.  ? Place a towel between your skin and the bag.  ? Leave the ice on for 20 minutes, 2-3 times per day.   If directed, apply heat to the affected area before you exercise. Use the heat source that your health care provider recommends, such as a moist heat pack or a heating pad.  ? Place a towel between your skin and the heat source.  ? Leave the heat on for 20-30 minutes.  ? Remove the heat if your skin turns bright red. This is especially important if you are unable to feel pain, heat, or cold. You may have a greater risk of getting burned.   If directed, massage the affected area.  Activity   Exercise as told by your health care provider. Gentle exercise is the best way to prevent or manage back pain.   Listen to your body when lifting. If lifting hurts, ask for help or bend your knees. This uses your leg muscles instead of your back muscles.   Squat down when picking up something from the floor. Do not bend over.   Only use bed rest for short periods as told by your health care provider. Bed rest should only be used for the most severe episodes of back pain.  Standing, sitting, and lying down   Do not stand in one place for long periods of time.   Use good posture when sitting. Make sure your head rests over your shoulders and is not hanging forward. Use a pillow on your lower back if necessary.   Try sleeping on your side, preferably the left side, with a pregnancy support pillow or 1-2 regular pillows between your legs.  ? If you have back pain after a night's rest, your bed may be too soft.  ? A firm mattress may provide more support for your back during  pregnancy.  General instructions   Do not wear high heels.   Eat a healthy diet. Try to gain weight within your health care provider's recommendations.   Use a maternity girdle, elastic sling, or back brace as told by your health care provider.   Take over-the-counter and prescription medicines only as told by your health care provider.   Work with a physical therapist or massage therapist to find ways to manage back pain. Acupuncture or massage therapy may be helpful.   Keep all follow-up visits as told by your health care provider. This is important.  Contact a health care provider if:   Your back pain interferes with your daily activities.   You have increasing pain in other parts of your body.  Get help right away if:   You develop numbness, tingling, weakness, or problems with the use of your arms or legs.   You develop severe back pain that is not controlled with medicine.   You have a change in bowel or bladder control.   You develop shortness of breath, dizziness, or you faint.   You develop nausea, vomiting, or sweating.   You have back pain that is a rhythmic, cramping pain similar to labor pains. Labor   pain is usually 1-2 minutes apart, lasts for about 1 minute, and involves a bearing down feeling or pressure in your pelvis.   You have back pain and your water breaks or you have vaginal bleeding.   You have back pain or numbness that travels down your leg.   Your back pain developed after you fell.   You develop pain on one side of your back.   You see blood in your urine.   You develop skin blisters in the area of your back pain.  Summary   Back pain may be caused by several factors that are related to changes during your pregnancy.   Follow instructions as told by your health care provider for managing pain, stiffness, and swelling.   Exercise as told by your health care provider. Gentle exercise is the best way to prevent or manage back pain.   Take over-the-counter and  prescription medicines only as told by your health care provider.   Keep all follow-up visits as told by your health care provider. This is important.  This information is not intended to replace advice given to you by your health care provider. Make sure you discuss any questions you have with your health care provider.  Document Released: 09/23/2005 Document Revised: 12/01/2017 Document Reviewed: 12/01/2017  Elsevier Interactive Patient Education  2019 [employer]

## 2018-08-30 NOTE — MAU Provider Note (Signed)
History     CSN: [id number]  Arrival date and time: 08/30/18 1133   First Provider Initiated Contact with Patient 08/30/18 1210      Chief Complaint  Patient presents with  . Back Pain  . Vaginal Discharge   G3P1001 @29 .5 wks presenting with LBP. Sx started yesterday. Pain is worse on on left side. Had some tingling in both legs earlier today. Denies strenuous activity, pushing, pulling, or falls. She took Tylenol and a warm bath but it didn't help. Denies urinary sx. Also reports leaking vaginal discharge that is clear and white. This has been ongoing for a few weeks. No VB. No recent IC. Denies ctx. Reports good FM.    OB History    Gravida  3   Para  1   Term  1   Preterm      AB  1   Living  1     SAB  1   TAB      Ectopic      Multiple      Live Births  1           Past Medical History:  Diagnosis Date  . Headache     Past Surgical History:  Procedure Laterality Date  . NO PAST SURGERIES      History reviewed. No pertinent family history.  Social History   Tobacco Use  . Smoking status: Former Games developer  . Smokeless tobacco: Former Neurosurgeon  . Tobacco comment: [place]  Substance Use Topics  . Alcohol use: Not Currently  . Drug use: Never    Allergies: No Known Allergies  Medications Prior to Admission  Medication Sig Dispense Refill Last Dose  . hydrOXYzine (VISTARIL) 25 MG capsule Take 1 capsule (25 mg total) by mouth 3 (three) times daily as needed. 30 capsule 0 Past Month at Unknown time  . ondansetron (ZOFRAN) 4 MG tablet Take 4 mg by mouth every 8 (eight) hours as needed for nausea or vomiting.   08/30/2018 at Unknown time  . Prenatal Vit-Fe Fumarate-FA (PREPLUS) 27-1 MG TABS Take 1 tablet by mouth daily. 30 tablet 13 08/29/2018 at Unknown time  . ranitidine (ZANTAC) 150 MG tablet Take 1 tablet (150 mg total) by mouth 2 (two) times daily. 60 tablet 0 Past Month at Unknown time    Review of Systems  Constitutional: Negative for chills and  fever.  Gastrointestinal: Negative for abdominal pain.  Genitourinary: Positive for vaginal discharge. Negative for dysuria, hematuria, urgency and vaginal bleeding.  Musculoskeletal: Positive for back pain.   Physical Exam   Blood pressure (!) 110/56, pulse 87, temperature 98.7 F (37.1 C), temperature source Oral, resp. rate 16, height 5\' 6"  (1.676 m), weight 74.2 kg, last menstrual period 02/03/2018, SpO2 98 %.  Physical Exam  Nursing note and vitals reviewed. Constitutional: She is oriented to person, place, and time. She appears well-developed and well-nourished. No distress.  HENT:  Head: Normocephalic and atraumatic.  Neck: Normal range of motion.  Cardiovascular: Normal rate.  Respiratory: Effort normal. No respiratory distress.  GI: Soft. She exhibits no distension. There is no abdominal tenderness. There is no CVA tenderness.  gravid  Genitourinary:    Genitourinary Comments: SSE: no pool, fern neg SVE: closed/thick   Musculoskeletal: Normal range of motion.     Cervical back: Normal.     Thoracic back: Normal.     Lumbar back: Normal.  Neurological: She is alert and oriented to person, place, and time.  Skin: Skin is  warm and dry.  Psychiatric: She has a normal mood and affect.  EFM: 150 bpm, mod variability, + accels, rare variable decels Toco: irritability  Results for orders placed or performed during the hospital encounter of 08/30/18 (from the past 24 hour(s))  Urinalysis, Routine w reflex microscopic     Status: None   Collection Time: 08/30/18 11:39 AM  Result Value Ref Range   Color, Urine YELLOW YELLOW   APPearance CLEAR CLEAR   Specific Gravity, Urine 1.018 1.005 - 1.030   pH 8.0 5.0 - 8.0   Glucose, UA NEGATIVE NEGATIVE mg/dL   Hgb urine dipstick NEGATIVE NEGATIVE   Bilirubin Urine NEGATIVE NEGATIVE   Ketones, ur NEGATIVE NEGATIVE mg/dL   Protein, ur NEGATIVE NEGATIVE mg/dL   Nitrite NEGATIVE NEGATIVE   Leukocytes,Ua NEGATIVE NEGATIVE  Wet prep,  genital     Status: Abnormal   Collection Time: 08/30/18 12:32 PM  Result Value Ref Range   Yeast Wet Prep HPF POC NONE SEEN NONE SEEN   Trich, Wet Prep NONE SEEN NONE SEEN   Clue Cells Wet Prep HPF POC NONE SEEN NONE SEEN   WBC, Wet Prep HPF POC MANY (A) NONE SEEN   Sperm NONE SEEN   Fern Test     Status: Normal   Collection Time: 08/30/18 12:32 PM  Result Value Ref Range   POCT Fern Test Negative = intact amniotic membranes    MAU Course  Procedures Orders Placed This Encounter  Procedures  . Wet prep, genital    Standing Status:   Standing    Number of Occurrences:   1  . Urinalysis, Routine w reflex microscopic    Standing Status:   Standing    Number of Occurrences:   1  . Fern Test    Standing Status:   Standing    Number of Occurrences:   1  . Discharge patient    Order Specific Question:   Discharge disposition    Answer:   01-Home or Self Care [1]    Order Specific Question:   Discharge patient date    Answer:   08/30/2018   Meds ordered this encounter  Medications  . cyclobenzaprine (FLEXERIL) tablet 10 mg  . Elastic Bandages & Supports (COMFORT FIT MATERNITY SUPP MED) MISC    Sig: 1 Device by Does not apply route daily.    Dispense:  1 each    Refill:  0    Order Specific Question:   Supervising Provider    Answer:   [place] Bing [[id number]]   MDM Labs ordered and reviewed. Pt reports no improvement of back pain after Flexeril. No evidence of UTI, pelvic infection, PROM, or PTL. Pain likely MSK and recommend comfort measures such as heating pad and maternity belt. Stable for discharge home.   Assessment and Plan   1. [redacted] weeks gestation of pregnancy   2. NST (non-stress test) reactive   3. Back pain affecting pregnancy in third trimester   4. Vaginal discharge during pregnancy in third trimester    Discharge home Follow up at [facility] as scheduled PTL precautions Rx Maternity belt  Allergies as of 08/30/2018   No Known Allergies      Medication List    TAKE these medications   COMFORT FIT MATERNITY SUPP MED Misc 1 Device by Does not apply route daily.   hydrOXYzine 25 MG capsule Commonly known as:  VISTARIL Take 1 capsule (25 mg total) by mouth 3 (three) times daily as needed.  ondansetron 4 MG tablet Commonly known as:  ZOFRAN Take 4 mg by mouth every 8 (eight) hours as needed for nausea or vomiting.   PREPLUS 27-1 MG Tabs Take 1 tablet by mouth daily.   ranitidine 150 MG tablet Commonly known as:  ZANTAC Take 1 tablet (150 mg total) by mouth 2 (two) times daily.      Donette Larry, CNM 08/30/2018, 2:26 PM

## 2018-08-30 NOTE — MAU Note (Signed)
Kelly Mcguire is a 28 y.o. at [redacted]w[redacted]d here in MAU reporting: states she started having back pain yesterday at work. States she tried tylenol and sitting in a warm bath but it didn't help. No bleeding, states she is having increased discharge, sometimes it is watery and other times she is unsure. No odor or itching. No LOF. + FM  Onset of complaint: yesterday  Pain score: 10/10  Vitals:   08/30/18 1147  BP: (!) 100/58  Pulse: 78  Resp: 18  Temp: 98.7 F (37.1 C)  SpO2: 98%      FHT:+ FM  Lab orders placed from triage: UA

## 2018-09-01 LAB — GC/CHLAMYDIA PROBE AMP (~~LOC~~) NOT AT ARMC
Chlamydia: NEGATIVE
Neisseria Gonorrhea: NEGATIVE

## 2018-11-03 ENCOUNTER — Inpatient Hospital Stay (HOSPITAL_COMMUNITY): Payer: 59 | Admitting: Anesthesiology

## 2018-11-03 ENCOUNTER — Other Ambulatory Visit: Payer: Self-pay

## 2018-11-03 ENCOUNTER — Encounter (HOSPITAL_COMMUNITY): Payer: Self-pay

## 2018-11-03 ENCOUNTER — Inpatient Hospital Stay (HOSPITAL_COMMUNITY)
Admission: AD | Admit: 2018-11-03 | Discharge: 2018-11-05 | DRG: 807 | Disposition: A | Discharge status: Home / Self Care | Payer: 59 | Attending: Obstetrics and Gynecology | Admitting: Obstetrics and Gynecology

## 2018-11-03 DIAGNOSIS — Z87891 Personal history of nicotine dependence: Secondary | ICD-10-CM

## 2018-11-03 DIAGNOSIS — Z3A39 39 weeks gestation of pregnancy: Secondary | ICD-10-CM | POA: Diagnosis not present

## 2018-11-03 DIAGNOSIS — O26893 Other specified pregnancy related conditions, third trimester: Secondary | ICD-10-CM | POA: Diagnosis present

## 2018-11-03 HISTORY — DX: Other specified health status: Z78.9

## 2018-11-03 LAB — TYPE AND SCREEN
ABO/RH(D): O POS
Antibody Screen: NEGATIVE

## 2018-11-03 LAB — CBC
HCT: 39.4 % (ref 36.0–46.0)
Hemoglobin: 13.3 g/dL (ref 12.0–15.0)
MCH: 30.6 pg (ref 26.0–34.0)
MCHC: 33.8 g/dL (ref 30.0–36.0)
MCV: 90.6 fL (ref 80.0–100.0)
Platelets: 148 10*3/uL — ABNORMAL LOW (ref 150–400)
RBC: 4.35 MIL/uL (ref 3.87–5.11)
RDW: 12.9 % (ref 11.5–15.5)
WBC: 7.5 10*3/uL (ref 4.0–10.5)
nRBC: 0 % (ref 0.0–0.2)

## 2018-11-03 LAB — ABO/RH: ABO/RH(D): O POS

## 2018-11-03 MED ORDER — EPHEDRINE 5 MG/ML INJ: 10.0000 mg | INTRAVENOUS | Status: DC | PRN

## 2018-11-03 MED ORDER — SOD CITRATE-CITRIC ACID 500-334 MG/5ML PO SOLN: 30.0000 mL | ORAL | Status: DC | PRN

## 2018-11-03 MED ORDER — LIDOCAINE HCL (PF) 1 % IJ SOLN
INTRAMUSCULAR | Status: DC | PRN
  Administered 2018-11-03 (×2): 5 mL via EPIDURAL

## 2018-11-03 MED ORDER — OXYTOCIN BOLUS FROM INFUSION: 500.0000 mL | Freq: Once | INTRAVENOUS | Status: DC

## 2018-11-03 MED ORDER — FLEET ENEMA 7-19 GM/118ML RE ENEM: 1.0000 | ENEMA | Freq: Every day | RECTAL | Status: DC | PRN

## 2018-11-03 MED ORDER — DIPHENHYDRAMINE HCL 50 MG/ML IJ SOLN: 12.5000 mg | INTRAMUSCULAR | Status: DC | PRN

## 2018-11-03 MED ORDER — PHENYLEPHRINE 40 MCG/ML (10ML) SYRINGE FOR IV PUSH (FOR BLOOD PRESSURE SUPPORT): 80.0000 ug | PREFILLED_SYRINGE | INTRAVENOUS | Status: DC | PRN

## 2018-11-03 MED ORDER — FENTANYL CITRATE (PF) 100 MCG/2ML IJ SOLN
INTRAMUSCULAR | Status: AC
  Administered 2018-11-03: 100 ug via INTRAVENOUS
  Filled 2018-11-03: qty 2

## 2018-11-03 MED ORDER — ONDANSETRON HCL 4 MG/2ML IJ SOLN: 4.0000 mg | Freq: Four times a day (QID) | INTRAMUSCULAR | Status: DC | PRN

## 2018-11-03 MED ORDER — ACETAMINOPHEN 325 MG PO TABS: 650.0000 mg | ORAL_TABLET | ORAL | Status: DC | PRN

## 2018-11-03 MED ORDER — FENTANYL-BUPIVACAINE-NACL 0.5-0.125-0.9 MG/250ML-% EP SOLN
12.0000 mL/h | EPIDURAL | Status: DC | PRN
  Filled 2018-11-03: qty 250

## 2018-11-03 MED ORDER — LACTATED RINGERS IV SOLN: 500.0000 mL | INTRAVENOUS | Status: DC | PRN

## 2018-11-03 MED ORDER — OXYTOCIN 40 UNITS IN NORMAL SALINE INFUSION - SIMPLE MED
2.5000 [IU]/h | INTRAVENOUS | Status: DC
  Filled 2018-11-03: qty 1000

## 2018-11-03 MED ORDER — LACTATED RINGERS IV SOLN
INTRAVENOUS | Status: DC
  Administered 2018-11-03: 22:00:00 via INTRAVENOUS

## 2018-11-03 MED ORDER — LACTATED RINGERS IV SOLN
500.0000 mL | Freq: Once | INTRAVENOUS | Status: AC
  Administered 2018-11-03: 500 mL via INTRAVENOUS

## 2018-11-03 MED ORDER — SODIUM CHLORIDE (PF) 0.9 % IJ SOLN
INTRAMUSCULAR | Status: DC | PRN
  Administered 2018-11-03: 14 mL/h via EPIDURAL

## 2018-11-03 MED ORDER — OXYCODONE-ACETAMINOPHEN 5-325 MG PO TABS: 2.0000 | ORAL_TABLET | ORAL | Status: DC | PRN

## 2018-11-03 MED ORDER — FENTANYL CITRATE (PF) 100 MCG/2ML IJ SOLN
50.0000 ug | INTRAMUSCULAR | Status: DC | PRN
  Administered 2018-11-03: 100 ug via INTRAVENOUS

## 2018-11-03 MED ORDER — LIDOCAINE HCL (PF) 1 % IJ SOLN
30.0000 mL | INTRAMUSCULAR | Status: DC | PRN
  Filled 2018-11-03: qty 30

## 2018-11-03 MED ORDER — OXYCODONE-ACETAMINOPHEN 5-325 MG PO TABS: 1.0000 | ORAL_TABLET | ORAL | Status: DC | PRN

## 2018-11-03 NOTE — H&P (Signed)
Kelly Mcguire is a 28 y.o. female presenting for leaking fluid  28 yo G3P1011 @ 39+0 presents c/o leaking fluid and was confirmed to have spontaneously ruptured membranes. Her pregnancy has been complicated by late prenatal care, and Pelvic girdle pain,  for which she has been taken out of work.  OB History    Gravida  3   Para  1   Term  1   Preterm      AB  1   Living  1     SAB  1   TAB      Ectopic      Multiple      Live Births  1          Past Medical History:  Diagnosis Date  . Headache    during second trimester   . Medical history non-contributory    Past Surgical History:  Procedure Laterality Date  . NO PAST SURGERIES     Family History: family history is not on file. Social History:  reports that she has quit smoking. She has quit using smokeless tobacco. She reports previous alcohol use. She reports that she does not use drugs.     Maternal Diabetes: No Genetic Screening: late prenatal care, unable to perform Maternal Ultrasounds/Referrals: Normal Fetal Ultrasounds or other Referrals:  Other: Physical therapy Maternal Substance Abuse:  No Significant Maternal Medications:  None Significant Maternal Lab Results:  None Other Comments:  None  ROS History Dilation: 3 Effacement (%): 90, 100 Station: -2 Exam by:: T Lytle RN  Blood pressure 136/77, pulse 99, temperature 98.5 F (36.9 C), temperature source Oral, resp. rate 20, height 5\' 6"  (1.676 m), weight 78.7 kg, last menstrual period 02/03/2018, SpO2 100 %. Exam Physical Exam  Prenatal labs: ABO, Rh: --/--/PENDING (05/07 2100)O pos Antibody: PENDING (05/07 2100) neg Rubella:  immune RPR:   NR HBsAg:   Neg HIV:   NR GBS:   Neg  Assessment/Plan: 1) Admit 2) Epidural on request 3) Expectant management   [name] 11/03/2018, 10:32 PM

## 2018-11-03 NOTE — Plan of Care (Signed)
Pt resting comfortably in bed after epidural. Assessment WNL, will continue to monitor and communicate with team as needed.   Problem: Education: Goal: Knowledge of General Education information will improve Description Including pain rating scale, medication(s)/side effects and non-pharmacologic comfort measures Outcome: Progressing   Problem: Health Behavior/Discharge Planning: Goal: Ability to manage health-related needs will improve Outcome: Progressing   Problem: Clinical Measurements: Goal: Ability to maintain clinical measurements within normal limits will improve Outcome: Progressing Goal: Will remain free from infection Outcome: Progressing Goal: Diagnostic test results will improve Outcome: Progressing Goal: Respiratory complications will improve Outcome: Progressing Goal: Cardiovascular complication will be avoided Outcome: Progressing   Problem: Activity: Goal: Risk for activity intolerance will decrease Outcome: Progressing   Problem: Nutrition: Goal: Adequate nutrition will be maintained Outcome: Progressing   Problem: Coping: Goal: Level of anxiety will decrease Outcome: Progressing   Problem: Elimination: Goal: Will not experience complications related to bowel motility Outcome: Progressing Goal: Will not experience complications related to urinary retention Outcome: Progressing   Problem: Pain Managment: Goal: General experience of comfort will improve Outcome: Progressing   Problem: Safety: Goal: Ability to remain free from injury will improve Outcome: Progressing   Problem: Skin Integrity: Goal: Risk for impaired skin integrity will decrease Outcome: Progressing   Problem: Education: Goal: Knowledge of Childbirth will improve Outcome: Progressing Goal: Ability to make informed decisions regarding treatment and plan of care will improve Outcome: Progressing Goal: Ability to state and carry out methods to decrease the pain will  improve Outcome: Progressing Goal: Individualized Educational Video(s) Outcome: Progressing   Problem: Coping: Goal: Ability to verbalize concerns and feelings about labor and delivery will improve Outcome: Progressing   Problem: Life Cycle: Goal: Ability to make normal progression through stages of labor will improve Outcome: Progressing Goal: Ability to effectively push during vaginal delivery will improve Outcome: Progressing   Problem: Role Relationship: Goal: Will demonstrate positive interactions with the child Outcome: Progressing   Problem: Safety: Goal: Risk of complications during labor and delivery will decrease Outcome: Progressing   Problem: Pain Management: Goal: Relief or control of pain from uterine contractions will improve Outcome: Progressing

## 2018-11-03 NOTE — MAU Note (Signed)
Pt here with c/o contractions and ROM at 1900. Denies any problems with the pregnancy and reports good fetal movement.

## 2018-11-03 NOTE — Anesthesia Preprocedure Evaluation (Signed)
Anesthesia Evaluation  Patient identified by MRN, date of birth, ID band Patient awake    Reviewed: Allergy & Precautions, NPO status , Patient's Chart, lab work & pertinent test results  Airway Mallampati: II  TM Distance: >3 FB Neck ROM: Full    Dental no notable dental hx. (+) Dental Advisory Given   Pulmonary neg pulmonary ROS, former smoker,    Pulmonary exam normal        Cardiovascular negative cardio ROS Normal cardiovascular exam     Neuro/Psych negative neurological ROS  negative psych ROS   GI/Hepatic negative GI ROS, Neg liver ROS,   Endo/Other  negative endocrine ROS  Renal/GU negative Renal ROS  negative genitourinary   Musculoskeletal negative musculoskeletal ROS (+)   Abdominal   Peds negative pediatric ROS (+)  Hematology negative hematology ROS (+)   Anesthesia Other Findings   Reproductive/Obstetrics (+) Pregnancy                             Anesthesia Physical Anesthesia Plan  ASA: II  Anesthesia Plan: Epidural   Post-op Pain Management:    Induction:   PONV Risk Score and Plan:   Airway Management Planned: Natural Airway  Additional Equipment:   Intra-op Plan:   Post-operative Plan:   Informed Consent: I have reviewed the patients History and Physical, chart, labs and discussed the procedure including the risks, benefits and alternatives for the proposed anesthesia with the patient or authorized representative who has indicated his/her understanding and acceptance.     Dental advisory given  Plan Discussed with: Anesthesiologist  Anesthesia Plan Comments:         Anesthesia Quick Evaluation  

## 2018-11-03 NOTE — Anesthesia Procedure Notes (Signed)
Epidural Patient location during procedure: OB Start time: 11/03/2018 9:54 PM End time: 11/03/2018 10:08 PM  Staffing Anesthesiologist: Heather Roberts, MD Performed: anesthesiologist   Preanesthetic Checklist Completed: patient identified, site marked, pre-op evaluation, timeout performed, IV checked, risks and benefits discussed and monitors and equipment checked  Epidural Patient position: sitting Prep: DuraPrep Patient monitoring: heart rate, cardiac monitor, continuous pulse ox and blood pressure Approach: midline Location: L2-L3 Injection technique: LOR saline  Needle:  Needle type: Tuohy  Needle gauge: 17 G Needle length: 9 cm Needle insertion depth: 7 cm Catheter size: 20 Guage Catheter at skin depth: 12 cm Test dose: negative and Other  Assessment Events: blood not aspirated, injection not painful, no injection resistance and negative IV test  Additional Notes Informed consent obtained prior to proceeding including risk of failure, 1% risk of PDPH, risk of minor discomfort and bruising.  Discussed rare but serious complications including epidural abscess, permanent nerve injury, epidural hematoma.  Discussed alternatives to epidural analgesia and patient desires to proceed.  Timeout performed pre-procedure verifying patient name, procedure, and platelet count.  Patient tolerated procedure well.

## 2018-11-04 ENCOUNTER — Encounter (HOSPITAL_COMMUNITY): Payer: Self-pay

## 2018-11-04 LAB — CBC
HCT: 39 % (ref 36.0–46.0)
Hemoglobin: 13.4 g/dL (ref 12.0–15.0)
MCH: 30.5 pg (ref 26.0–34.0)
MCHC: 34.4 g/dL (ref 30.0–36.0)
MCV: 88.6 fL (ref 80.0–100.0)
Platelets: 140 10*3/uL — ABNORMAL LOW (ref 150–400)
RBC: 4.4 MIL/uL (ref 3.87–5.11)
RDW: 12.7 % (ref 11.5–15.5)
WBC: 15.7 10*3/uL — ABNORMAL HIGH (ref 4.0–10.5)
nRBC: 0 % (ref 0.0–0.2)

## 2018-11-04 LAB — RPR: RPR Ser Ql: NONREACTIVE

## 2018-11-04 MED ORDER — SIMETHICONE 80 MG PO CHEW: 80.0000 mg | CHEWABLE_TABLET | ORAL | Status: DC | PRN

## 2018-11-04 MED ORDER — BENZOCAINE-MENTHOL 20-0.5 % EX AERO: 1.0000 "application " | INHALATION_SPRAY | CUTANEOUS | Status: DC | PRN

## 2018-11-04 MED ORDER — ONDANSETRON HCL 4 MG PO TABS: 4.0000 mg | ORAL_TABLET | ORAL | Status: DC | PRN

## 2018-11-04 MED ORDER — METHYLERGONOVINE MALEATE 0.2 MG PO TABS: 0.2000 mg | ORAL_TABLET | ORAL | Status: DC | PRN

## 2018-11-04 MED ORDER — METHYLERGONOVINE MALEATE 0.2 MG/ML IJ SOLN
0.2000 mg | Freq: Once | INTRAMUSCULAR | Status: AC
  Administered 2018-11-04: 0.2 mg via INTRAMUSCULAR

## 2018-11-04 MED ORDER — OXYTOCIN 10 UNIT/ML IJ SOLN
10.0000 [IU] | Freq: Once | INTRAMUSCULAR | Status: AC
  Administered 2018-11-04: 10 [IU] via INTRAMUSCULAR

## 2018-11-04 MED ORDER — METHYLERGONOVINE MALEATE 0.2 MG/ML IJ SOLN
INTRAMUSCULAR | Status: AC
  Filled 2018-11-04: qty 1

## 2018-11-04 MED ORDER — METHYLERGONOVINE MALEATE 0.2 MG/ML IJ SOLN: 0.2000 mg | INTRAMUSCULAR | Status: DC | PRN

## 2018-11-04 MED ORDER — DIBUCAINE (PERIANAL) 1 % EX OINT: 1.0000 "application " | TOPICAL_OINTMENT | CUTANEOUS | Status: DC | PRN

## 2018-11-04 MED ORDER — WITCH HAZEL-GLYCERIN EX PADS: 1.0000 "application " | MEDICATED_PAD | CUTANEOUS | Status: DC | PRN

## 2018-11-04 MED ORDER — IBUPROFEN 600 MG PO TABS
600.0000 mg | ORAL_TABLET | Freq: Four times a day (QID) | ORAL | Status: DC
  Administered 2018-11-04 – 2018-11-05 (×4): 600 mg via ORAL
  Filled 2018-11-04 (×5): qty 1

## 2018-11-04 MED ORDER — COCONUT OIL OIL: 1.0000 "application " | TOPICAL_OIL | Status: DC | PRN

## 2018-11-04 MED ORDER — SENNOSIDES-DOCUSATE SODIUM 8.6-50 MG PO TABS
2.0000 | ORAL_TABLET | ORAL | Status: DC
  Administered 2018-11-04: 2 via ORAL
  Filled 2018-11-04: qty 2

## 2018-11-04 MED ORDER — DIPHENHYDRAMINE HCL 25 MG PO CAPS: 25.0000 mg | ORAL_CAPSULE | Freq: Four times a day (QID) | ORAL | Status: DC | PRN

## 2018-11-04 MED ORDER — ZOLPIDEM TARTRATE 5 MG PO TABS: 5.0000 mg | ORAL_TABLET | Freq: Every evening | ORAL | Status: DC | PRN

## 2018-11-04 MED ORDER — ACETAMINOPHEN 325 MG PO TABS
650.0000 mg | ORAL_TABLET | ORAL | Status: DC | PRN
  Administered 2018-11-04: 650 mg via ORAL
  Filled 2018-11-04: qty 2

## 2018-11-04 MED ORDER — OXYCODONE-ACETAMINOPHEN 5-325 MG PO TABS: 2.0000 | ORAL_TABLET | ORAL | Status: DC | PRN

## 2018-11-04 MED ORDER — OXYTOCIN 10 UNIT/ML IJ SOLN
INTRAMUSCULAR | Status: AC
  Filled 2018-11-04: qty 1

## 2018-11-04 MED ORDER — OXYCODONE-ACETAMINOPHEN 5-325 MG PO TABS: 1.0000 | ORAL_TABLET | ORAL | Status: DC | PRN

## 2018-11-04 MED ORDER — TETANUS-DIPHTH-ACELL PERTUSSIS 5-2.5-18.5 LF-MCG/0.5 IM SUSP: 0.5000 mL | Freq: Once | INTRAMUSCULAR | Status: DC

## 2018-11-04 MED ORDER — ONDANSETRON HCL 4 MG/2ML IJ SOLN: 4.0000 mg | INTRAMUSCULAR | Status: DC | PRN

## 2018-11-04 MED ORDER — PRENATAL MULTIVITAMIN CH
1.0000 | ORAL_TABLET | Freq: Every day | ORAL | Status: DC
  Administered 2018-11-05: 11:00:00 1 via ORAL
  Filled 2018-11-04 (×2): qty 1

## 2018-11-04 NOTE — Anesthesia Postprocedure Evaluation (Signed)
Anesthesia Post Note  Patient: Kelly Mcguire  Procedure(s) Performed: AN AD HOC LABOR EPIDURAL     Patient location during evaluation: Mother Baby Anesthesia Type: Epidural Level of consciousness: awake and alert and oriented Pain management: satisfactory to patient Vital Signs Assessment: post-procedure vital signs reviewed and stable Respiratory status: respiratory function stable Cardiovascular status: stable Postop Assessment: no headache, no backache, epidural receding, patient able to bend at knees, no signs of nausea or vomiting and adequate PO intake Anesthetic complications: no    Last Vitals:  Vitals:   11/04/18 0430 11/04/18 0720  BP: 129/81 134/84  Pulse: 88 88  Resp: 18 18  Temp: 36.7 C (!) 36.4 C  SpO2: 100% 98%    Last Pain:  Vitals:   11/04/18 0720  TempSrc: Oral  PainSc: 0-No pain   Pain Goal:                Epidural/Spinal Function Cutaneous sensation: Normal sensation (11/04/18 0720), Patient able to flex knees: Yes (11/04/18 0720), Patient able to lift hips off bed: Yes (11/04/18 0720), Back pain beyond tenderness at insertion site: No (11/04/18 0720), Progressively worsening motor and/or sensory loss: No (11/04/18 0720), Bowel and/or bladder incontinence post epidural: No (11/04/18 0720)  [name]

## 2018-11-04 NOTE — Progress Notes (Signed)
Patient is eating, ambulating, voiding.  Pain control is good.  Appropriate lochia, no complaints.  Vitals:   11/04/18 0230 11/04/18 0330 11/04/18 0430 11/04/18 0720  BP: 109/86 136/82 129/81 134/84  Pulse:  90 88 88  Resp:  16 18 18   Temp:  98.4 F (36.9 C) 98.1 F (36.7 C) (!) 97.5 F (36.4 C)  TempSrc:  Oral Oral Oral  SpO2:  100% 100% 98%  Weight:      Height:        Fundus firm Abd: soft, nontender Ext: no calf tenderness  Lab Results  Component Value Date   WBC 15.7 (H) 11/04/2018   HGB 13.4 11/04/2018   HCT 39.0 11/04/2018   MCV 88.6 11/04/2018   PLT 140 (L) 11/04/2018    --/--/O POS, O POS Performed at [facility], [address], [place], [place] [address]  (05/07 2100)  A/P Post partum day 1. Doing well  Routine care.  Expect d/c 5/9.    Kelly Mcguire

## 2018-11-04 NOTE — Lactation Note (Signed)
This note was copied from a baby's chart. Lactation Consultation Note  Patient Name: Kelly Mcguire [id number] Date: 11/04/2018 Reason for consult: Initial assessment;Term P2 Baby is 47 hours old.  Mom breastfed her first baby without difficulty.  She states newborn is latching well.  She denies questions or concerns.  Instructed to feed with cues and call for assist prn.  Breastfeeding consultation services and support information given.  Maternal Data    Feeding Feeding Type: Breast Fed  LATCH Score                   Interventions    Lactation Tools Discussed/Used     Consult Status Consult Status: Follow-up Date: 11/05/18 Follow-up type: In-patient    Huston Foley 11/04/2018, 2:55 PM

## 2018-11-05 NOTE — Lactation Note (Signed)
This note was copied from a baby's chart. Lactation Consultation Note  Patient Name: Kelly Mcguire [id number] Date: 11/05/2018 Reason for consult: Follow-up assessment;Term  Visited with P2 Mom of term baby at 43 hrs old.  Baby at 6% weight loss.  Mom breastfed for a year with 1st baby (who is 30 yrs old).    Mom about to put baby on the breast using cradle hold.  Suggested she try cross cradle as baby is learning.  Assisted with this, and Mom shown how to support baby on pillow, and support her breast and baby's head.  Baby able to attain a deep latch to breast causing some uterine cramping.  Mom encouraged to keep baby STS and watch for cues that baby is ready to breast feed.  Encouraged >8 feedings per 24 hrs.  Mom has a DEBP at home.  Engorgement prevention and treatment reviewed.    Mom denies any problems, and states she will call prn.  Mom aware of OP lactation support available to her.    Consult Status Consult Status: Complete Date: 11/05/18 Follow-up type: Call as needed    Judee Clara 11/05/2018, 11:40 AM

## 2018-11-05 NOTE — Progress Notes (Signed)
CSW received consult for hx of Anxiety and Depression.  CSW met with MOB to offer support and complete assessment.     CSW spoke with MOB at bedside Upon entering the room CSW observed that MOB was holding infant while nursing. CSW greeted MOB with smile and asked if CSW could come in. MOB was agreeable to this. CSW introduced role as well as congratulated MOB on the birth of infant. CSW explained for reason for visit.   MOB expressed that was never formally diagnosed with Anxiety however she is seeing a counselor in [place] reported that she last saw Sharyn Lull who is her counselor before COVID began. MOB expressed that she has a desire to follow back up with her as needed. MOB epressed that she is not on ,eds to anxiety either.   CSW assessed MOB for safety . MOB currently denies feeling SI or HI. MOB also reported no DV. CSW notified that MOB plans to take infant to [facility] here in [place].   CSW provided education regarding the baby blues period vs. perinatal mood disorders, discussed treatment and gave resources for mental health follow up if concerns arise.  CSW recommends self-evaluation during the postpartum time period using the New Mom Checklist from [employer] and encouraged MOB to contact a medical professional if symptoms are noted at any time.   CSW provided review of Sudden Infant Death Syndrome (SIDS) precautions.   CSW identifies no further need for intervention and no barriers to discharge at this time.    Virgie Dad Ylonda Storr, MSW, LCSW-A [facility] [phone]

## 2018-11-05 NOTE — Discharge Summary (Signed)
Obstetric Discharge Summary Reason for Admission: rupture of membranes Prenatal Procedures: ultrasound Intrapartum Procedures: spontaneous vaginal delivery Postpartum Procedures: none Complications-Operative and Postpartum: 2nd degree perineal laceration Hemoglobin  Date Value Ref Range Status  11/04/2018 13.4 12.0 - 15.0 g/dL Final   HCT  Date Value Ref Range Status  11/04/2018 39.0 36.0 - 46.0 % Final    Physical Exam:  General: alert and cooperative Lochia: appropriate Uterine Fundus: firm DVT Evaluation: No evidence of DVT seen on physical exam.  Discharge Diagnoses: Term Pregnancy-delivered  Discharge Information: Date: 11/05/2018 Activity: pelvic rest Diet: routine Medications: PNV and Ibuprofen Condition: stable Instructions: refer to practice specific booklet Discharge to: home Follow-up Information    Kelly Reeds, MD Follow up in 4 week(s).   Specialty:  Obstetrics and Gynecology Contact information: [address] [place] [address] [phone]           Newborn Data: Live born female  Birth Weight: 7 lb 12.5 oz (3530 g) APGAR: 9, 9  Newborn Delivery   Birth date/time:  11/04/2018 00:53:00 Delivery type:  Vaginal, Spontaneous     Home with mother.  Kelly Mcguire 11/05/2018, 8:03 AM
